# Patient Record
Sex: Female | Born: 1970 | Marital: Married | Smoking: Never smoker
Health system: Southern US, Community
[De-identification: ages and names within clinical notes are randomized; demographics above are authoritative.]

---

## 2016-01-29 ENCOUNTER — Emergency Department: Payer: Self-pay

## 2016-01-29 ENCOUNTER — Encounter: Payer: Self-pay | Admitting: Emergency Medicine

## 2016-01-29 ENCOUNTER — Emergency Department
Admission: EM | Admit: 2016-01-29 | Discharge: 2016-01-29 | Disposition: A | Discharge status: Home / Self Care | Payer: Self-pay | Attending: Emergency Medicine | Admitting: Emergency Medicine

## 2016-01-29 DIAGNOSIS — R200 Anesthesia of skin: Secondary | ICD-10-CM | POA: Insufficient documentation

## 2016-01-29 DIAGNOSIS — M79602 Pain in left arm: Secondary | ICD-10-CM | POA: Insufficient documentation

## 2016-01-29 DIAGNOSIS — R51 Headache: Secondary | ICD-10-CM | POA: Insufficient documentation

## 2016-01-29 DIAGNOSIS — R519 Headache, unspecified: Secondary | ICD-10-CM

## 2016-01-29 LAB — COMPREHENSIVE METABOLIC PANEL
ALBUMIN: 4 g/dL (ref 3.5–5.0)
ALT: 13 U/L — ABNORMAL LOW (ref 14–54)
ANION GAP: 8 (ref 5–15)
AST: 19 U/L (ref 15–41)
Alkaline Phosphatase: 99 U/L (ref 38–126)
BILIRUBIN TOTAL: 0.3 mg/dL (ref 0.3–1.2)
BUN: 14 mg/dL (ref 6–20)
CHLORIDE: 102 mmol/L (ref 101–111)
CO2: 25 mmol/L (ref 22–32)
Calcium: 9.3 mg/dL (ref 8.9–10.3)
Creatinine, Ser: 0.58 mg/dL (ref 0.44–1.00)
GFR calc Af Amer: >60 mL/min (ref >60)
GFR calc non Af Amer: >60 mL/min (ref >60)
GLUCOSE: 151 mg/dL — AB (ref 65–99)
POTASSIUM: 3.6 mmol/L (ref 3.5–5.1)
SODIUM: 135 mmol/L (ref 135–145)
TOTAL PROTEIN: 8 g/dL (ref 6.5–8.1)

## 2016-01-29 LAB — URINALYSIS COMPLETE WITH MICROSCOPIC (ARMC ONLY)
Bacteria, UA: NONE SEEN
Bilirubin Urine: NEGATIVE
Glucose, UA: NEGATIVE mg/dL
Hgb urine dipstick: NEGATIVE
KETONES UR: NEGATIVE mg/dL
Leukocytes, UA: NEGATIVE
Nitrite: NEGATIVE
PH: 6 (ref 5.0–8.0)
PROTEIN: NEGATIVE mg/dL
RBC / HPF: NONE SEEN RBC/hpf (ref 0–5)
Specific Gravity, Urine: 1.004 — ABNORMAL LOW (ref 1.005–1.030)

## 2016-01-29 LAB — CBC
HCT: 35.7 % (ref 35.0–47.0)
Hemoglobin: 11.8 g/dL — ABNORMAL LOW (ref 12.0–16.0)
MCH: 27.1 pg (ref 26.0–34.0)
MCHC: 33 g/dL (ref 32.0–36.0)
MCV: 82.1 fL (ref 80.0–100.0)
Platelets: 299 10*3/uL (ref 150–440)
RBC: 4.34 MIL/uL (ref 3.80–5.20)
RDW: 15.1 % — AB (ref 11.5–14.5)
WBC: 9.1 10*3/uL (ref 3.6–11.0)

## 2016-01-29 LAB — TROPONIN I: Troponin I: <0.03 ng/mL (ref <0.031)

## 2016-01-29 MED ORDER — GABAPENTIN 100 MG PO CAPS: 100.0000 mg | ORAL_CAPSULE | Freq: Two times a day (BID) | ORAL | Status: AC

## 2016-01-29 MED ORDER — LORAZEPAM 2 MG/ML IJ SOLN
1.0000 mg | Freq: Once | INTRAMUSCULAR | Status: AC
  Administered 2016-01-29: 1 mg via INTRAVENOUS
  Filled 2016-01-29: qty 1

## 2016-01-29 NOTE — ED Notes (Signed)
Left side headache starting x3 days ago , radiating to left arm with heaviness, left jaw heaviness

## 2016-01-29 NOTE — ED Notes (Signed)
Pt denies needing an interpreter at this time.

## 2016-01-29 NOTE — ED Notes (Signed)
Patient transported to CT 

## 2016-01-29 NOTE — ED Notes (Signed)
Patient transported to MRI 

## 2016-01-29 NOTE — ED Provider Notes (Signed)
[facility] Emergency Department Provider Note  Time seen: 2:58 PM  I have reviewed the triage vital signs and the nursing notes.   HISTORY  Chief Complaint Headache and Arm Pain    HPI Holly Stafford is a 45 y.o. female with no past medical history presents to the emergency department with left arm pain radiating to her jaw and occasional numbness on the left side of her head. According to the patient for the past 3 days she has been experiencing pain in her left arm, as well as her left jaw. Patient also states the headache/numbness sensation to the left side of her head. Denies any weakness or numbness in the arms or legs. Denies any slurred speech, confusion. Describes the headache as mild more of a numbness sensation. Denies any numbness or weakness in the face.    History reviewed. No pertinent past medical history.  There are no active problems to display for this patient.   History reviewed. No pertinent past surgical history.  No current outpatient prescriptions on file.  Allergies Review of patient's allergies indicates no known allergies.  No family history on file.  Social History Social History  Substance Use Topics  . Smoking status: Never Smoker   . Smokeless tobacco: None  . Alcohol Use: None    Review of Systems Constitutional: Negative for fever. Cardiovascular: Negative for chest pain. Respiratory: Negative for shortness of breath. Gastrointestinal: Negative for abdominal pain Musculoskeletal: Left arm pain, left jaw pain. Neurological: Negative for headache. Occasional numbness sensation to the left side of her scalp. 10-point ROS otherwise negative.  ____________________________________________   PHYSICAL EXAM:  Constitutional: Alert and oriented. Well appearing and in no distress. Eyes: Normal exam ENT   Head: Normocephalic and atraumatic.   Mouth/Throat: Mucous membranes are moist. Cardiovascular: Normal  rate, regular rhythm. No murmur Respiratory: Normal respiratory effort without tachypnea nor retractions. Breath sounds are clear Gastrointestinal: Soft and nontender. No distention.   Musculoskeletal: Nontender with normal range of motion in all extremities. Neurologic:  Normal speech and language. No gross focal neurologic deficits. Equal grip bilaterally. No pronator drift. Cranial nerves intact. Skin:  Skin is warm, dry and intact.  Psychiatric: Mood and affect are normal. Speech and behavior are normal.  ____________________________________________    EKG  EKG reviewed and interpreted, so shows normal sinus rhythm at 80 bpm, narrow QRS, normal axis, normal intervals, no ST changes.  ____________________________________________    RADIOLOGY  CT of the head is normal.   INITIAL IMPRESSION / ASSESSMENT AND PLAN / ED COURSE  Pertinent labs & imaging results that were available during my care of the patient were reviewed by me and considered in my medical decision making (see chart for details).  Patient presents the emergency department 3 days of pain in her left neck shooting down her left arm at times. She also states occasional headache with a numbness sensation to the back of her left scalp. Denies any arm weakness or numbness. Denies any leg weakness or numbness. Denies any facial weakness or numbness. Overall the patient appears very well currently, no distress. States mild pain in her left arm and left neck. Denies any worsening of the pain with arm or neck movement.  CT scan is negative. EKG is normal. Currently awaiting lab results.  CT scan is negative. Labs are within normal limits. Troponin is negative.  I discussed the findings with the patient she now states that the left side of her face feels numb. No  weakness on exam. Cranial nerves intact. Given the facial numbness will proceed with an MRI of the brain to rule out CVA, patient agreeable.  MRI shows no acute  abnormality. We'll discharge the patient on a course of Neurontin, with PCP follow-up. Patient agreeable to plan. ____________________________________________   FINAL CLINICAL IMPRESSION(S) / ED DIAGNOSES  Left arm pain Headache Facial numbness/tingling  Minna AntisKevin Sylva Overley, MD 01/29/16 1737

## 2016-01-29 NOTE — Discharge Instructions (Signed)
Dolor de cabeza general sin causa (General Headache Without Cause) El dolor de cabeza es un dolor o malestar que se siente en la zona de la cabeza o del cuello. Puede no tener una causa especfica. Hay muchas causas y tipos de dolores de Turkmenistancabeza. Los dolores de cabeza ms comunes son los siguientes:  Cefalea tensional.  Cefaleas migraosas.  Cefalea en brotes.  Cefaleas diarias crnicas. INSTRUCCIONES PARA EL CUIDADO EN EL HOGAR  Controle su afeccin para ver si hay cambios. Siga estos pasos para Scientist, physiologicalcontrolar la afeccin: Control del Reynolds Americandolor  Tome los medicamentos de venta libre y los recetados solamente como se lo haya indicado el mdico.  Cuando sienta dolor de cabeza acustese en un cuarto oscuro y tranquilo.  Si se lo indican, aplique hielo sobre la cabeza y la zona del cuello:  Ponga el hielo en una bolsa plstica.  Coloque una toalla entre la piel y la bolsa de hielo.  Coloque el hielo durante 20minutos, 2 a 3veces por Futures traderda.  Utilice una almohadilla trmica o tome una ducha con agua caliente para aplicar calor en la cabeza y la zona del cuello como se lo haya indicado el mdico.  Mantenga las luces tenues si le Liz Claibornemolesta las luces brillantes o sus dolores de cabeza empeoran. Comida y bebida  Mantenga un horario para las comidas.  Limite el consumo de bebidas alcohlicas.  Consuma menos cantidad de cafena o deje de tomarla. Instrucciones generales  Concurra a todas las visitas de control como se lo haya indicado el mdico. Esto es importante.  Lleve un diario de los dolores de cabeza para Financial risk analystaveriguar qu factores pueden desencadenarlos. Por ejemplo, escriba los siguientes datos:  Lo que usted come y Estate agentbebe.  Cunto tiempo duerme.  Algn cambio en su dieta o en los medicamentos.  Pruebe algunas tcnicas de relajacin, como los Caliomasajes.  Limite el estrs.  Sintese con la espalda recta y no tense los msculos.  No consuma productos que contengan tabaco, incluidos  cigarrillos, tabaco de Theatre managermascar o cigarrillos electrnicos. Si necesita ayuda para dejar de fumar, consulte al mdico.  Haga actividad fsica habitualmente como se lo haya indicado el mdico.  Tenga un horario fijo para dormir. Duerma entre 7 y 9horas o la cantidad de horas que le haya recomendado el mdico. SOLICITE ATENCIN MDICA SI:   Los medicamentos no Materials engineerlogran aliviar los sntomas.  Tiene un dolor de cabeza que es diferente del dolor de cabeza habitual.  Tiene nuseas o vmitos.  Tiene fiebre. SOLICITE ATENCIN MDICA DE INMEDIATO SI:   El dolor se hace cada vez ms intenso.  Ha vomitado repetidas veces.  Presenta rigidez en el cuello.  Sufre prdida de la visin.  Tiene problemas para hablar.  Siente dolor en el ojo o en el odo.  Presenta debilidad muscular o prdida del control muscular.  Pierde el equilibrio o tiene problemas para Advertising account plannercaminar.  Sufre mareos o se desmaya.  Se siente confundido.   Esta informacin no tiene Theme park managercomo fin reemplazar el consejo del mdico. Asegrese de hacerle al mdico cualquier pregunta que tenga.   Document Released: 07/21/2005 Document Revised: 07/02/2015 Elsevier Interactive Patient Education 2016 [employer]  Parestesia (Paresthesia) La parestesia es una sensacin de ardor o picazn que puede aparecer en cualquier parte del cuerpo. Suele Wells Fargomanifestarse en las manos, los brazos, las piernas o los pies. Por lo general, no es dolorosa. En la International Business Machinesmayora de los casos, la sensacin desaparece al poco tiempo y no es un signo de un problema grave.  CUIDADOS EN EL HOGAR  Evite el consumo de alcohol.  Pruebe con masajes o acupuntura para Technical sales engineeraliviar la sensacin de Dentistmalestar.  Concurra a todas las visitas de control como se lo haya indicado el mdico. Esto es importante. SOLICITE AYUDA SI:  Contina teniendo episodios de parestesia.  La sensacin de ardor o picazn empeora al caminar.  Siente dolor o tiene calambres.  Siente mareos.  Tiene  una erupcin cutnea. SOLICITE AYUDA DE INMEDIATO SI:  Se siente dbil.  Tiene dificultad para caminar o moverse.  Tiene problemas para hablar, comprender o ver.  Se siente confundido.  No puede controlar la orina (miccin) ni la evacuacin de la materia fecal (defecacin).  Pierde la sensibilidad (adormecimiento) despus de una lesin.  Pierde el conocimiento (se desmaya).   Esta informacin no tiene Theme park managercomo fin reemplazar el consejo del mdico. Asegrese de hacerle al mdico cualquier pregunta que tenga.   Document Released: 11/13/2010 Document Revised: 02/25/2015 Elsevier Interactive Patient Education [employer]2016 Elsevier Inc.

## 2017-03-11 IMAGING — CT CT HEAD W/O CM
1 series · 16 of 30 positions shown, 20 images · non-contrast
Comparison: None.

CLINICAL DATA: Left-sided headache for 3 days

EXAM:
CT HEAD WITHOUT CONTRAST
TECHNIQUE: Contiguous axial images were obtained from the base of the skull
through the vertex without intravenous contrast.

[Series 2: head wo · axial · 0.39mm/px · z∈[-35,+91]mm · 16 of 32 slices shown, 20 images]
[im 2/32  brain]
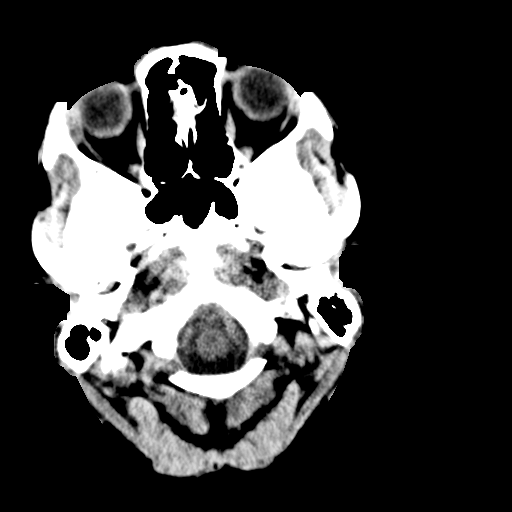
[im 2/32  bone]
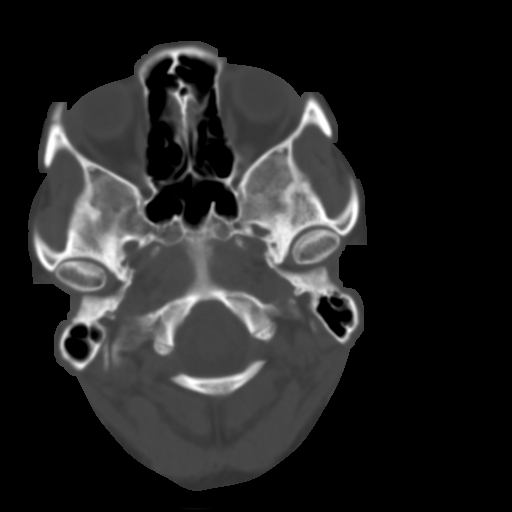
[im 4/32  brain]
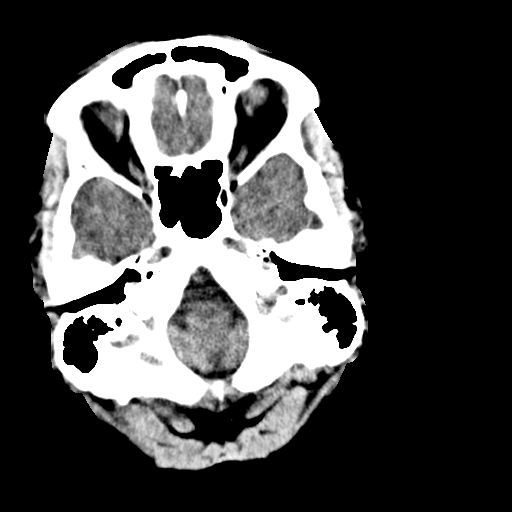
[im 6/32  brain]
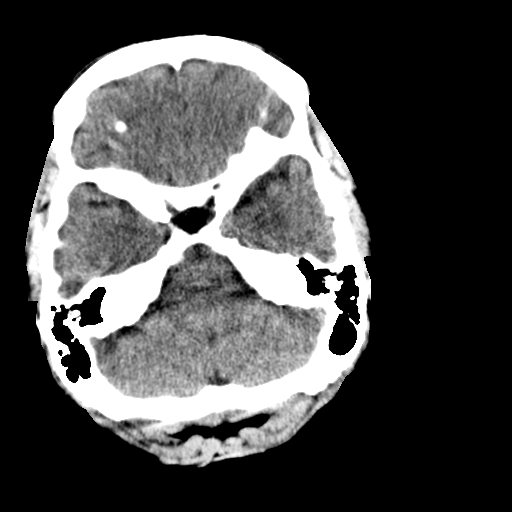
[im 8/32  brain]
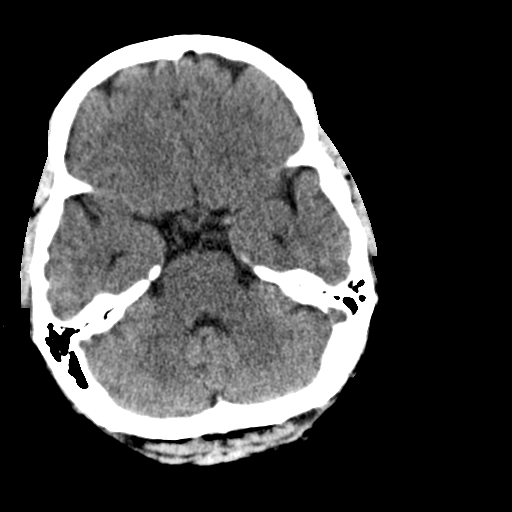
[im 9/32  brain]
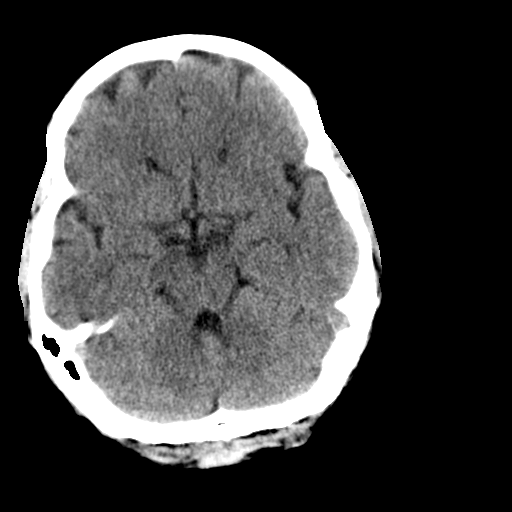
[im 9/32  bone]
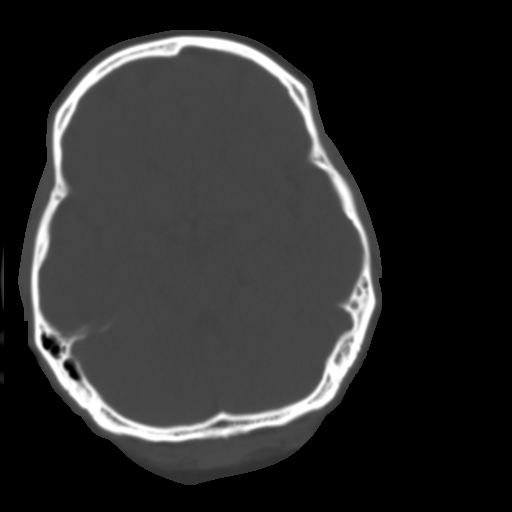
[im 11/32  brain]
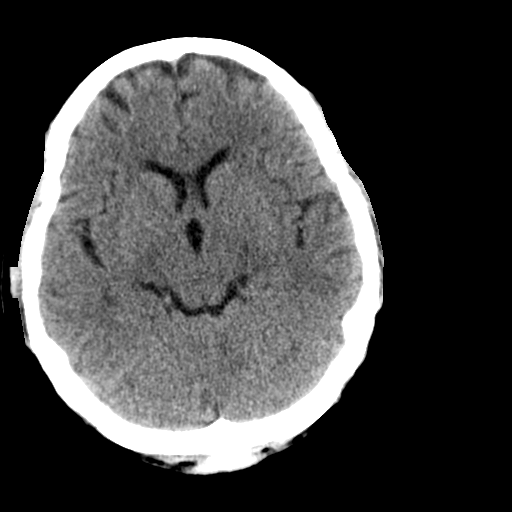
[im 13/32  brain]
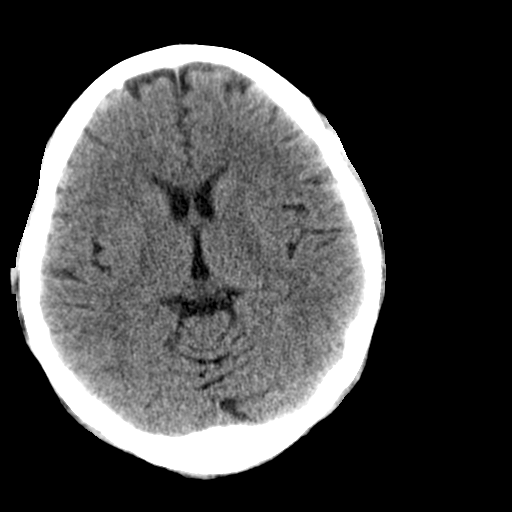
[im 15/32  brain]
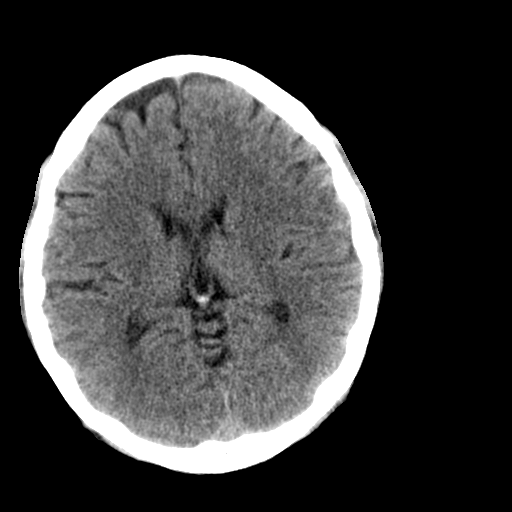
[im 17/32  brain]
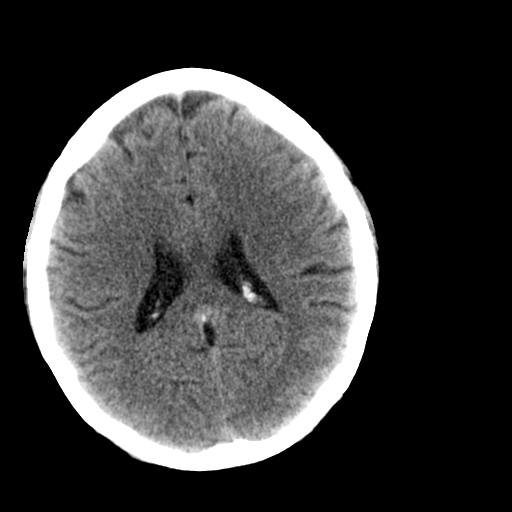
[im 17/32  bone]
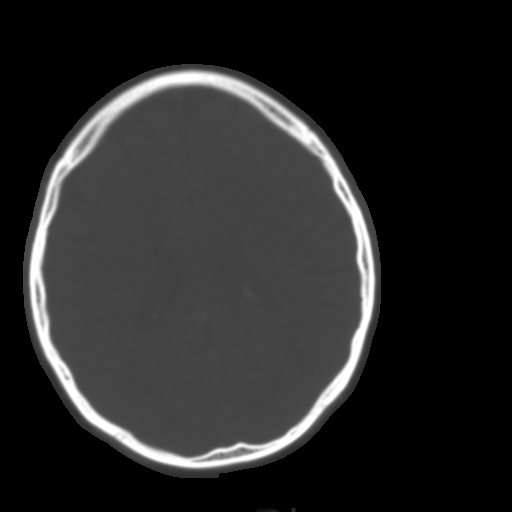
[im 19/32  brain]
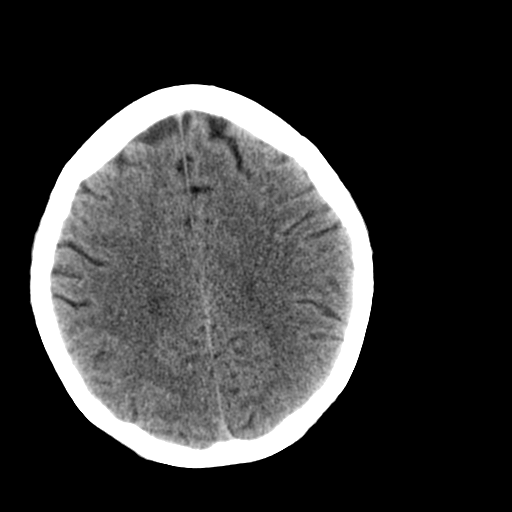
[im 21/32  brain]
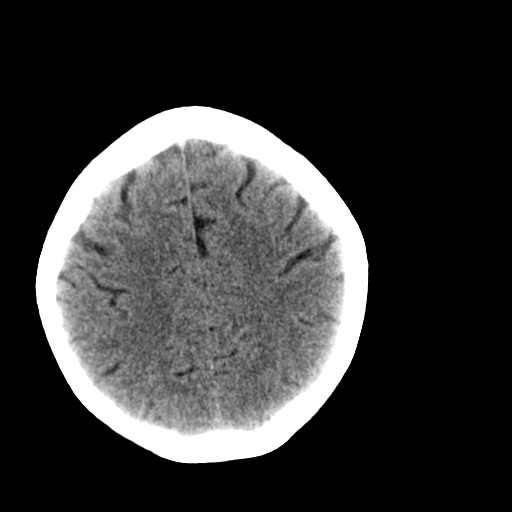
[im 23/32  brain]
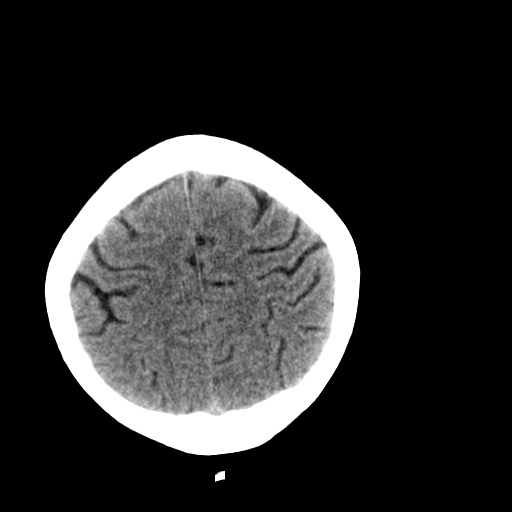
[im 24/32  brain]
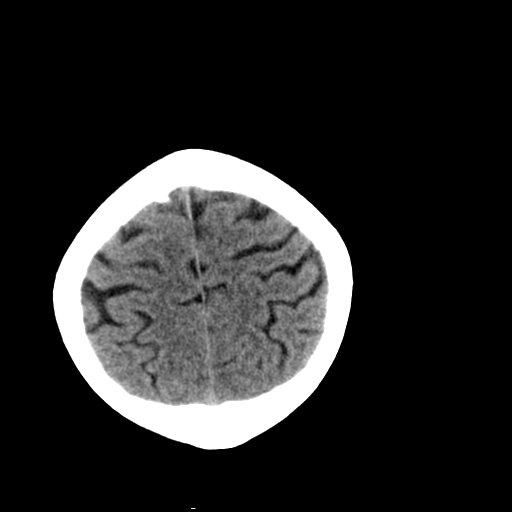
[im 24/32  bone]
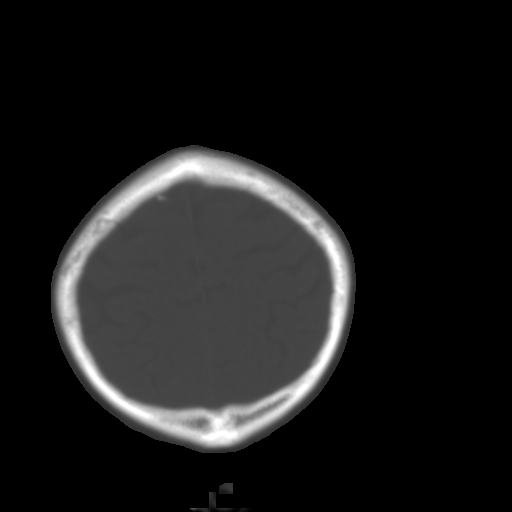
[im 26/32  brain]
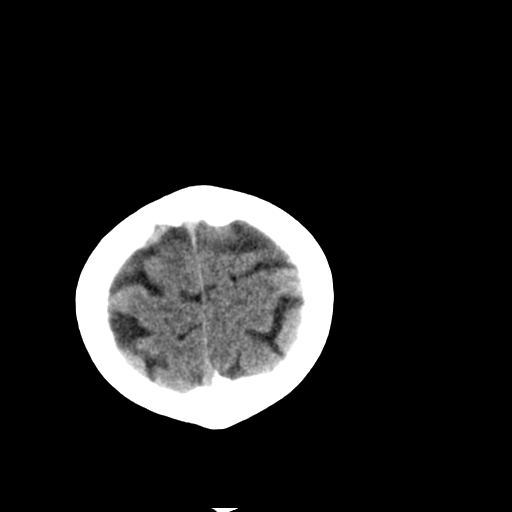
[im 28/32  brain]
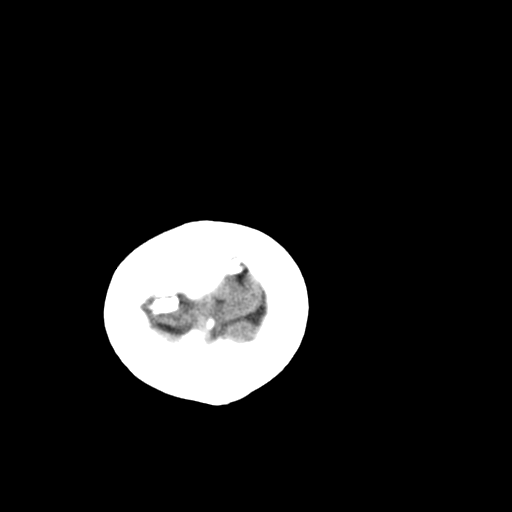
[im 30/32  brain]
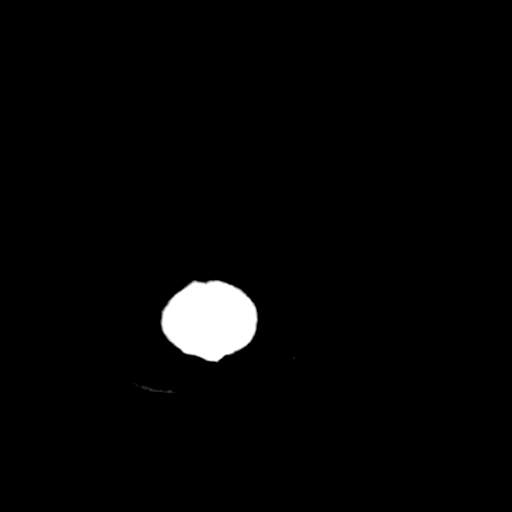

[16 of 30 positions shown; findings below may reference images not displayed]

FINDINGS: No mass lesion. No midline shift. No acute hemorrhage or hematoma.
No extra-axial fluid collections. No evidence of acute infarction.
Calvarium intact.
IMPRESSION: Normal head CT
